# Patient Record
Sex: Female | Born: 1995 | Race: White | Hispanic: No | Marital: Single | Smoking: Never smoker
Health system: Southern US, Community
[De-identification: ages and names within clinical notes are randomized; demographics above are authoritative.]

## PROBLEM LIST (undated history)

## (undated) HISTORY — PX: WISDOM TOOTH EXTRACTION: SHX21

---

## 2017-02-23 ENCOUNTER — Encounter (HOSPITAL_BASED_OUTPATIENT_CLINIC_OR_DEPARTMENT_OTHER): Payer: Self-pay | Admitting: Emergency Medicine

## 2017-02-23 ENCOUNTER — Emergency Department (HOSPITAL_BASED_OUTPATIENT_CLINIC_OR_DEPARTMENT_OTHER)
Admission: EM | Admit: 2017-02-23 | Discharge: 2017-02-23 | Disposition: A | Discharge status: Home / Self Care | Payer: Worker's Compensation | Attending: Emergency Medicine | Admitting: Emergency Medicine

## 2017-02-23 DIAGNOSIS — Y9389 Activity, other specified: Secondary | ICD-10-CM | POA: Diagnosis not present

## 2017-02-23 DIAGNOSIS — W228XXA Striking against or struck by other objects, initial encounter: Secondary | ICD-10-CM | POA: Diagnosis not present

## 2017-02-23 DIAGNOSIS — S060X0A Concussion without loss of consciousness, initial encounter: Secondary | ICD-10-CM | POA: Insufficient documentation

## 2017-02-23 DIAGNOSIS — Y990 Civilian activity done for income or pay: Secondary | ICD-10-CM | POA: Diagnosis not present

## 2017-02-23 DIAGNOSIS — S098XXA Other specified injuries of head, initial encounter: Secondary | ICD-10-CM | POA: Diagnosis present

## 2017-02-23 DIAGNOSIS — Y92511 Restaurant or cafe as the place of occurrence of the external cause: Secondary | ICD-10-CM | POA: Diagnosis not present

## 2017-02-23 DIAGNOSIS — S0990XA Unspecified injury of head, initial encounter: Secondary | ICD-10-CM
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
## 2017-02-23 NOTE — ED Notes (Signed)
Pt given d/c instructions as per chart. Verbalizes understanding. No questions. 

## 2017-02-23 NOTE — Discharge Instructions (Signed)
Tylenol and Motrin for pain.  Use ice over the area that is swollen.  Return here for any worsening in your condition.
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
## 2020-01-02 ENCOUNTER — Encounter (HOSPITAL_BASED_OUTPATIENT_CLINIC_OR_DEPARTMENT_OTHER): Payer: Self-pay

## 2020-01-02 ENCOUNTER — Emergency Department (HOSPITAL_BASED_OUTPATIENT_CLINIC_OR_DEPARTMENT_OTHER)
Admission: EM | Admit: 2020-01-02 | Discharge: 2020-01-02 | Disposition: A | Discharge status: Home / Self Care | Payer: BC Managed Care – PPO | Attending: Emergency Medicine | Admitting: Emergency Medicine

## 2020-01-02 ENCOUNTER — Other Ambulatory Visit: Payer: Self-pay

## 2020-01-02 DIAGNOSIS — Z79899 Other long term (current) drug therapy: Secondary | ICD-10-CM | POA: Insufficient documentation

## 2020-01-02 DIAGNOSIS — Z20822 Contact with and (suspected) exposure to covid-19: Secondary | ICD-10-CM | POA: Diagnosis not present

## 2020-01-02 DIAGNOSIS — F410 Panic disorder [episodic paroxysmal anxiety] without agoraphobia: Secondary | ICD-10-CM | POA: Diagnosis not present

## 2020-01-02 MED ORDER — HYDROXYZINE HCL 25 MG PO TABS: 25.0000 mg | ORAL_TABLET | Freq: Four times a day (QID) | ORAL | 0 refills | Status: AC | PRN

## 2020-01-02 MED ORDER — LORAZEPAM 2 MG/ML IJ SOLN
1.0000 mg | Freq: Once | INTRAMUSCULAR | Status: AC
  Administered 2020-01-02: 1 mg via INTRAMUSCULAR
  Filled 2020-01-02: qty 1

## 2020-01-02 NOTE — ED Triage Notes (Addendum)
Pt states she has not been feeling well for 3 days symptoms include generalized malaise, abd pain, nausea, vomiting. Today she started to have some lightheadedness and generalized fatigue. Pt states while on the way to the Dr., she started to get anxious and then developed tingling in her hands bilaterally and hand cramps. Pt noted to have hyperventilations during triage.

## 2020-01-02 NOTE — ED Notes (Signed)
Patient now texting on phone, NAD returned to lobby, RN aware.

## 2020-01-02 NOTE — ED Notes (Signed)
Patient's mother has arrived to drive her home

## 2020-01-02 NOTE — Discharge Instructions (Signed)
You likely have a panic attack.  Take hydroxyzine for anxiety.  Stay hydrated.  You were tested for Covid and please stay home until results come back.  If you have Covid you will need to stay home for 10 days  Return to ER if you have worse numbness or weakness or trouble speaking, fever, trouble breathing

## 2020-01-02 NOTE — ED Provider Notes (Signed)
[facility] EMERGENCY DEPARTMENT Provider Note   CSN: [id number] Arrival date & time: 01/02/20  1657     History Chief Complaint  Patient presents with  . Panic Attack    Kari Schneider is a 24 y.o. female previous history of Covid, here presenting with anxiety and nausea.  Patient states that she has been having sore throat for the last several days.  She states that her mother was sick with similar symptoms and tested negative for Covid.  She states that she had Covid previously and then had the vaccine.  She denies any obvious Covid exposure.  She has some nausea and muscle aches for last several days.  She states that she was trying to feeding to urgent care and there was no appointment and was driving home and suddenly had a panic attack.  She states that she was hyperventilating and then both of her hands became numb.  While in triage, she was given a brown bag and she states that now she feels better.  She is requesting something for anxiety.  The history is provided by the patient.       History reviewed. No pertinent past medical history.  There are no problems to display for this patient.   History reviewed. No pertinent surgical history.   OB History   No obstetric history on file.     No family history on file.  Social History   Tobacco Use  . Smoking status: Never Smoker  . Smokeless tobacco: Never Used  Vaping Use  . Vaping Use: Some days  Substance Use Topics  . Alcohol use: Yes  . Drug use: Not on file    Home Medications Prior to Admission medications   Medication Sig Start Date End Date Taking? Authorizing Provider  Norgestimate-Ethinyl Estradiol Triphasic (TRINESSA, 28,) 0.18/0.215/0.25 MG-35 MCG tablet TK 1 T PO QD 04/29/15  Yes [provider]    Allergies    Patient has no known allergies.  Review of Systems   Review of Systems  Neurological: Positive for numbness.  Psychiatric/Behavioral: The patient is  nervous/anxious.   All other systems reviewed and are negative.   Physical Exam Updated Vital Signs BP (!) 132/110 (BP Location: Right Arm)   Pulse (!) 126   Resp 16   Ht 5\' 4"  (1.626 m)   Wt 59 kg   LMP 12/23/2019   SpO2 100%   BMI 22.31 kg/m   Physical Exam Vitals and nursing note reviewed.  Constitutional:      Comments: Anxious   HENT:     Head: Normocephalic.     Nose: Nose normal.     Mouth/Throat:     Mouth: Mucous membranes are dry.     Comments: Posterior pharynx is clear Eyes:     Extraocular Movements: Extraocular movements intact.     Pupils: Pupils are equal, round, and reactive to light.  Cardiovascular:     Rate and Rhythm: Normal rate and regular rhythm.     Pulses: Normal pulses.     Heart sounds: Normal heart sounds.  Pulmonary:     Effort: Pulmonary effort is normal.     Breath sounds: Normal breath sounds.  Abdominal:     General: Abdomen is flat.     Palpations: Abdomen is soft.  Musculoskeletal:        General: Normal range of motion.     Cervical back: Normal range of motion.  Skin:    General: Skin is warm.  Capillary Refill: Capillary refill takes less than 2 seconds.  Neurological:     General: No focal deficit present.     Mental Status: She is oriented to person, place, and time.     Cranial Nerves: No cranial nerve deficit.     Sensory: No sensory deficit.     Comments: Normal sensation and strength bilaterally.  No facial droop and cranial nerves II to XII intact  Psychiatric:        Mood and Affect: Mood normal.        Behavior: Behavior normal.     ED Results / Procedures / Treatments   Labs (all labs ordered are listed, but only abnormal results are displayed) Labs Reviewed  SARS CORONAVIRUS 2 (TAT 6-24 HRS)    EKG None  Radiology No results found.  Procedures Procedures (including critical care time)  Medications Ordered in ED Medications  LORazepam (ATIVAN) injection 1 mg (has no administration in time  range)    ED Course  I have reviewed the triage vital signs and the nursing notes.  Pertinent labs & imaging results that were available during my care of the patient were reviewed by me and considered in my medical decision making (see chart for details).    MDM Rules/Calculators/A&P                          Kari Schneider is a 24 y.o. female here presenting with panic attack.  Patient was tachycardic on arrival.  After some reassurance her heart rate went down to around 100.  Patient had some numbness initially but no neuro deficit on my exam.  Has viral URI symptoms and patient did get the Covid vaccine previously.  Patient is not hypoxic and lungs are clear.  Patient requested Covid test.  She also requests something for anxiety.  Given Ativan in the ED and will discharge home with hydroxyzine.  Final Clinical Impression(s) / ED Diagnoses Final diagnoses:  None    Rx / DC Orders ED Discharge Orders    None       Charlynne Pander, MD 01/02/20 1918

## 2020-01-03 LAB — SARS CORONAVIRUS 2 (TAT 6-24 HRS): SARS Coronavirus 2: NEGATIVE

## 2020-10-26 ENCOUNTER — Encounter (HOSPITAL_BASED_OUTPATIENT_CLINIC_OR_DEPARTMENT_OTHER): Payer: Self-pay | Admitting: Emergency Medicine

## 2020-10-26 ENCOUNTER — Emergency Department (HOSPITAL_BASED_OUTPATIENT_CLINIC_OR_DEPARTMENT_OTHER)
Admission: EM | Admit: 2020-10-26 | Discharge: 2020-10-26 | Disposition: A | Discharge status: Home / Self Care | Payer: BC Managed Care – PPO | Attending: Emergency Medicine | Admitting: Emergency Medicine

## 2020-10-26 ENCOUNTER — Other Ambulatory Visit: Payer: Self-pay

## 2020-10-26 DIAGNOSIS — F419 Anxiety disorder, unspecified: Secondary | ICD-10-CM | POA: Insufficient documentation

## 2020-10-26 DIAGNOSIS — R110 Nausea: Secondary | ICD-10-CM | POA: Diagnosis not present

## 2020-10-26 MED ORDER — ALPRAZOLAM 0.5 MG PO TABS
0.2500 mg | ORAL_TABLET | Freq: Once | ORAL | Status: AC
  Administered 2020-10-26: 11:00:00 0.25 mg via ORAL
  Filled 2020-10-26: qty 1

## 2020-10-26 MED ORDER — ALPRAZOLAM 0.25 MG PO TABS: 0.2500 mg | ORAL_TABLET | Freq: Every day | ORAL | 0 refills | Status: DC | PRN

## 2020-10-26 NOTE — ED Provider Notes (Signed)
[facility] EMERGENCY DEPARTMENT Provider Note   CSN: [id number] Arrival date & time: 10/26/20  5009     History Chief Complaint  Patient presents with   Anxiety    Kari Schneider is a 25 y.o. female.  Comes in to ER with concern for anxiety.  Patient reports that she struggles a lot with anxiety.  Followed closely by her primary care doctor.  Has been taking antidepressant as well as Xanax.  States that she ran out of her prescription of Xanax a couple days ago and decided to stop.  Had been taking it most days.  Has not had any seizure-like activity.  No episodes of passing out.  Since stopping the medication she is having increasing anxiety, is concerned that she is having an anxiety attack.  Has had some associated nausea.  No vomiting.  Feels at times depersonalized.  Does not have any thoughts of hurting other people or hurting herself.  Yesterday also did not take her previously prescribed Paxil.  Has had major life stressors recently, mother is currently hospitalized for acute psychosis.  Brother is schizophrenic and on the streets in [place].  HPI     History reviewed. No pertinent past medical history.  There are no problems to display for this patient.   History reviewed. No pertinent surgical history.   OB History   No obstetric history on file.     No family history on file.  Social History   Tobacco Use   Smoking status: Never   Smokeless tobacco: Never  Vaping Use   Vaping Use: Some days  Substance Use Topics   Alcohol use: Yes    Home Medications Prior to Admission medications   Medication Sig Start Date End Date Taking? Authorizing Provider  ALPRAZolam (XANAX) 0.25 MG tablet Take 1 tablet (0.25 mg total) by mouth daily as needed for anxiety. 10/26/20  Yes Milagros Loll, MD  hydrOXYzine (ATARAX/VISTARIL) 25 MG tablet Take 1 tablet (25 mg total) by mouth every 6 (six) hours as needed. 01/02/20   Charlynne Pander, MD  Norgestimate-Ethinyl  Estradiol Triphasic (TRINESSA, 28,) 0.18/0.215/0.25 MG-35 MCG tablet TK 1 T PO QD 04/29/15   [provider]    Allergies    Patient has no known allergies.  Review of Systems   Review of Systems  Constitutional:  Negative for chills and fever.  HENT:  Negative for ear pain and sore throat.   Eyes:  Negative for pain and visual disturbance.  Respiratory:  Negative for cough and shortness of breath.   Cardiovascular:  Negative for chest pain and palpitations.  Gastrointestinal:  Negative for abdominal pain and vomiting.  Genitourinary:  Negative for dysuria and hematuria.  Musculoskeletal:  Negative for arthralgias and back pain.  Skin:  Negative for color change and rash.  Neurological:  Negative for seizures and syncope.  Psychiatric/Behavioral:  The patient is nervous/anxious.   All other systems reviewed and are negative.  Physical Exam Updated Vital Signs BP 120/75 (BP Location: Right Arm)   Pulse 74   Temp 99.5 F (37.5 C)   Resp 16   Ht 5\' 4"  (1.626 m)   Wt 58.1 kg   SpO2 99%   BMI 21.97 kg/m   Physical Exam Vitals and nursing note reviewed.  Constitutional:      General: She is not in acute distress.    Appearance: She is well-developed.  HENT:     Head: Normocephalic and atraumatic.  Eyes:  Conjunctiva/sclera: Conjunctivae normal.  Cardiovascular:     Rate and Rhythm: Normal rate and regular rhythm.     Heart sounds: No murmur heard. Pulmonary:     Effort: Pulmonary effort is normal. No respiratory distress.     Breath sounds: Normal breath sounds.  Abdominal:     Palpations: Abdomen is soft.     Tenderness: There is no abdominal tenderness.  Musculoskeletal:        General: No deformity or signs of injury.     Cervical back: Neck supple.  Skin:    General: Skin is warm and dry.  Neurological:     General: No focal deficit present.     Mental Status: She is alert and oriented to person, place, and time.  Psychiatric:     Comments:  Somewhat anxious but calm, no SI or HI    ED Results / Procedures / Treatments   Labs (all labs ordered are listed, but only abnormal results are displayed) Labs Reviewed - No data to display  EKG None  Radiology No results found.  Procedures Procedures   Medications Ordered in ED Medications  ALPRAZolam Prudy Feeler) tablet 0.25 mg (0.25 mg Oral Given 10/26/20 1044)    ED Course  I have reviewed the triage vital signs and the nursing notes.  Pertinent labs & imaging results that were available during my care of the patient were reviewed by me and considered in my medical decision making (see chart for details).    MDM Rules/Calculators/A&P                          25 year old presented to ER with concern for anxiety.  Has long history of anxiety, followed closely by her primary care doctor.  States that she ran out of her Xanax a couple days ago.  Reviewed PMP.  Last Xanax filled on 6/10 0.25 mg 30 tablets.  Patient appears somewhat anxious but not in any sort of distress.  Vital signs are all normal.  She does not appear to be having any frank withdrawal symptoms.  Recommend that she continue her Xanax as needed for now but follow-up closely with her primary care doctor to discuss longer-term plan.  Gave patient very small Rx until patient is able to have follow-up with primary doctor. No SI/HI/AVH today.     After the discussed management above, the patient was determined to be safe for discharge.  The patient was in agreement with this plan and all questions regarding their care were answered.  ED return precautions were discussed and the patient will return to the ED with any significant worsening of condition.  Final Clinical Impression(s) / ED Diagnoses Final diagnoses:  Anxiety    Rx / DC Orders ED Discharge Orders          Ordered    ALPRAZolam (XANAX) 0.25 MG tablet  Daily PRN        10/26/20 1051             Milagros Loll, MD 10/26/20 1058

## 2020-10-26 NOTE — ED Triage Notes (Signed)
Pt reports anxiety attack since yesterday. Pt stating that she stopped her anti depressant and xanax 2 days ago. Denies SI/HI.

## 2020-10-26 NOTE — Discharge Instructions (Addendum)
Recommend continuing your previously prescribed medications as instructed by your primary doctor.  Please call their clinic this morning to request a close follow-up appointment, ideally to be seen in the next couple days.  I have sent in a short prescription for your Xanax intended to cover you until you are able to be seen by her primary doctor to develop longer-term plan.  If you have any seizure-like activity, develop vomiting, thoughts of hurting herself or hurting other people, come back to ER for reassessment.

## 2020-11-16 ENCOUNTER — Emergency Department (HOSPITAL_BASED_OUTPATIENT_CLINIC_OR_DEPARTMENT_OTHER)
Admission: EM | Admit: 2020-11-16 | Discharge: 2020-11-16 | Disposition: A | Discharge status: Home / Self Care | Payer: BC Managed Care – PPO | Attending: Emergency Medicine | Admitting: Emergency Medicine

## 2020-11-16 ENCOUNTER — Encounter (HOSPITAL_BASED_OUTPATIENT_CLINIC_OR_DEPARTMENT_OTHER): Payer: Self-pay | Admitting: Emergency Medicine

## 2020-11-16 ENCOUNTER — Other Ambulatory Visit: Payer: Self-pay

## 2020-11-16 ENCOUNTER — Emergency Department (HOSPITAL_BASED_OUTPATIENT_CLINIC_OR_DEPARTMENT_OTHER): Payer: BC Managed Care – PPO

## 2020-11-16 DIAGNOSIS — F410 Panic disorder [episodic paroxysmal anxiety] without agoraphobia: Secondary | ICD-10-CM | POA: Insufficient documentation

## 2020-11-16 DIAGNOSIS — R0600 Dyspnea, unspecified: Secondary | ICD-10-CM | POA: Diagnosis not present

## 2020-11-16 DIAGNOSIS — Z79899 Other long term (current) drug therapy: Secondary | ICD-10-CM | POA: Diagnosis not present

## 2020-11-16 DIAGNOSIS — R0602 Shortness of breath: Secondary | ICD-10-CM | POA: Diagnosis present

## 2020-11-16 NOTE — ED Provider Notes (Signed)
[facility] EMERGENCY DEPARTMENT Provider Note   CSN: [id number] Arrival date & time: 11/16/20  0945     History Chief Complaint  Patient presents with   Anxiety    Kari Schneider is a 25 y.o. female.  Was recently switched from Xanax to clonazepam.  She states that she was doing therapy until last week.  She stopped liking her therapist and is also interested in in person not virtual options.  Still under significant amount of personal stress.   The history is provided by the patient and a parent.  Shortness of Breath Severity:  Moderate Onset quality:  Sudden Duration:  1 day Timing:  Intermittent Progression:  Unchanged Chronicity:  New Context comment:  History of anxiety, panic. Recent switch in the type of benzodiazepine she is taking. Unsure if this is panic or other issue. Relieved by:  Nothing Worsened by:  Nothing Ineffective treatments: tried taking additional benzodiazepines. Associated symptoms: no abdominal pain, no chest pain, no cough, no ear pain, no fever, no rash, no sore throat and no vomiting       History reviewed. No pertinent past medical history.  There are no problems to display for this patient.   History reviewed. No pertinent surgical history.   OB History   No obstetric history on file.     No family history on file.  Social History   Tobacco Use   Smoking status: Never   Smokeless tobacco: Never  Vaping Use   Vaping Use: Some days  Substance Use Topics   Alcohol use: Yes    Home Medications Prior to Admission medications   Medication Sig Start Date End Date Taking? Authorizing Provider  ALPRAZolam (XANAX) 0.25 MG tablet Take 1 tablet (0.25 mg total) by mouth daily as needed for anxiety. 10/26/20   Milagros Loll, MD  clonazePAM (KLONOPIN) 0.5 MG tablet Take 0.5 mg by mouth 2 (two) times daily as needed. 11/15/20   [provider]  hydrOXYzine (ATARAX/VISTARIL) 25 MG tablet Take 1 tablet (25 mg total) by  mouth every 6 (six) hours as needed. 01/02/20   Charlynne Pander, MD  Norgestimate-Ethinyl Estradiol Triphasic (TRINESSA, 28,) 0.18/0.215/0.25 MG-35 MCG tablet TK 1 T PO QD 04/29/15   [provider]    Allergies    Patient has no known allergies.  Review of Systems   Review of Systems  Constitutional:  Negative for chills and fever.  HENT:  Negative for ear pain and sore throat.   Eyes:  Negative for pain and visual disturbance.  Respiratory:  Positive for shortness of breath. Negative for cough.   Cardiovascular:  Negative for chest pain and palpitations.  Gastrointestinal:  Negative for abdominal pain and vomiting.  Genitourinary:  Negative for dysuria and hematuria.  Musculoskeletal:  Negative for arthralgias and back pain.  Skin:  Negative for color change and rash.  Neurological:  Negative for seizures and syncope.  All other systems reviewed and are negative.  Physical Exam Updated Vital Signs BP 119/77 (BP Location: Right Arm)   Pulse 90   Temp 98 F (36.7 C) (Oral)   Resp (!) 22   Ht 5\' 4"  (1.626 m)   Wt 58.1 kg   SpO2 98%   BMI 21.97 kg/m   Physical Exam Vitals and nursing note reviewed.  Constitutional:      Appearance: She is well-developed.  HENT:     Head: Normocephalic and atraumatic.  Cardiovascular:     Rate and Rhythm: Normal rate and  regular rhythm.     Heart sounds: Normal heart sounds.  Pulmonary:     Effort: Pulmonary effort is normal. No tachypnea.     Breath sounds: Normal breath sounds.  Musculoskeletal:     Right lower leg: No edema.     Left lower leg: No edema.  Skin:    General: Skin is warm and dry.  Neurological:     General: No focal deficit present.     Mental Status: She is alert and oriented to person, place, and time.  Psychiatric:        Mood and Affect: Mood normal.        Behavior: Behavior normal.    ED Results / Procedures / Treatments   Labs (all labs ordered are listed, but only abnormal results are  displayed) Labs Reviewed - No data to display  EKG EKG Interpretation  Date/Time:  Wednesday November 16 2020 09:58:20 EDT Ventricular Rate:  97 PR Interval:  117 QRS Duration: 85 QT Interval:  355 QTC Calculation: 451 R Axis:   75 Text Interpretation: Sinus rhythm Borderline short PR interval normal axis No acute ischemia Confirmed by Pieter Partridge ([id number]) on 11/16/2020 10:29:31 AM  Radiology DG Chest 2 View  Result Date: 11/16/2020 CLINICAL DATA:  Shortness of breath EXAM: CHEST - 2 VIEW COMPARISON:  09/22/2018 FINDINGS: The heart size and mediastinal contours are within normal limits. Both lungs are clear. The visualized skeletal structures are unremarkable. IMPRESSION: Normal study. Electronically Signed   By: Charlett Nose M.D.   On: 11/16/2020 11:02    Procedures Procedures   Medications Ordered in ED Medications - No data to display  ED Course  I have reviewed the triage vital signs and the nursing notes.  Pertinent labs & imaging results that were available during my care of the patient were reviewed by me and considered in my medical decision making (see chart for details).    MDM Rules/Calculators/A&P                           Matthew Pais presents with dyspnea and anxiety symptoms.  Symptoms do seem consistent with her history of panic disorder, but in order to exclude other etiologies such as pneumonia, cardiac arrhythmia, obvious myocardial infarction, chest x-ray and EKG were performed.  Symptoms do not seem consistent with COVID-19.  She was advised to contact her prescriber as benzodiazepine modifications will be handled by this prescriber.  We discussed the fact that this medication has a possibility to be habit-forming, and just like an opioid, it needs to be prescribed by one person.  We also discussed resuming therapy. Final Clinical Impression(s) / ED Diagnoses Final diagnoses:  Shortness of breath  Panic disorder    Rx / DC Orders ED Discharge Orders      None        Koleen Distance, MD 11/16/20 1112

## 2020-11-16 NOTE — ED Notes (Signed)
Pt discharged to home. Discharge instructions have been discussed with patient and/or family members. Pt verbally acknowledges understanding d/c instructions, and endorses comprehension to checkout at registration before leaving.  °

## 2020-11-16 NOTE — ED Triage Notes (Signed)
Pt reports feeling anxious yesterday and SHOB that started yesterday afternoon. Pt reports her Dr. Riley Churches her anxiety medication recently.

## 2021-04-04 ENCOUNTER — Emergency Department (HOSPITAL_COMMUNITY)
Admission: EM | Admit: 2021-04-04 | Discharge: 2021-04-04 | Disposition: A | Discharge status: Home / Self Care | Payer: BC Managed Care – PPO | Attending: Emergency Medicine | Admitting: Emergency Medicine

## 2021-04-04 ENCOUNTER — Emergency Department (HOSPITAL_COMMUNITY): Payer: BC Managed Care – PPO

## 2021-04-04 ENCOUNTER — Encounter (HOSPITAL_COMMUNITY): Payer: Self-pay | Admitting: Emergency Medicine

## 2021-04-04 ENCOUNTER — Other Ambulatory Visit: Payer: Self-pay

## 2021-04-04 DIAGNOSIS — R519 Headache, unspecified: Secondary | ICD-10-CM | POA: Diagnosis not present

## 2021-04-04 DIAGNOSIS — Z3A19 19 weeks gestation of pregnancy: Secondary | ICD-10-CM | POA: Diagnosis not present

## 2021-04-04 DIAGNOSIS — O9A212 Injury, poisoning and certain other consequences of external causes complicating pregnancy, second trimester: Secondary | ICD-10-CM | POA: Insufficient documentation

## 2021-04-04 DIAGNOSIS — O26892 Other specified pregnancy related conditions, second trimester: Secondary | ICD-10-CM | POA: Insufficient documentation

## 2021-04-04 DIAGNOSIS — R1030 Lower abdominal pain, unspecified: Secondary | ICD-10-CM | POA: Insufficient documentation

## 2021-04-04 DIAGNOSIS — Y92410 Unspecified street and highway as the place of occurrence of the external cause: Secondary | ICD-10-CM | POA: Diagnosis not present

## 2021-04-04 DIAGNOSIS — M542 Cervicalgia: Secondary | ICD-10-CM | POA: Insufficient documentation

## 2021-04-04 LAB — COMPREHENSIVE METABOLIC PANEL
ALT: 19 U/L (ref 0–44)
AST: 25 U/L (ref 15–41)
Albumin: 3.2 g/dL — ABNORMAL LOW (ref 3.5–5.0)
Alkaline Phosphatase: 44 U/L (ref 38–126)
Anion gap: 7 (ref 5–15)
BUN: 5 mg/dL — ABNORMAL LOW (ref 6–20)
CO2: 22 mmol/L (ref 22–32)
Calcium: 8.9 mg/dL (ref 8.9–10.3)
Chloride: 107 mmol/L (ref 98–111)
Creatinine, Ser: 0.53 mg/dL (ref 0.44–1.00)
GFR, Estimated: >60 mL/min (ref >60)
Glucose, Bld: 106 mg/dL — ABNORMAL HIGH (ref 70–99)
Potassium: 3.6 mmol/L (ref 3.5–5.1)
Sodium: 136 mmol/L (ref 135–145)
Total Bilirubin: 0.4 mg/dL (ref 0.3–1.2)
Total Protein: 5.9 g/dL — ABNORMAL LOW (ref 6.5–8.1)

## 2021-04-04 LAB — I-STAT CHEM 8, ED
BUN: 5 mg/dL — ABNORMAL LOW (ref 6–20)
Calcium, Ion: 1.2 mmol/L (ref 1.15–1.40)
Chloride: 105 mmol/L (ref 98–111)
Creatinine, Ser: 0.5 mg/dL (ref 0.44–1.00)
Glucose, Bld: 101 mg/dL — ABNORMAL HIGH (ref 70–99)
HCT: 30 % — ABNORMAL LOW (ref 36.0–46.0)
Hemoglobin: 10.2 g/dL — ABNORMAL LOW (ref 12.0–15.0)
Potassium: 3.7 mmol/L (ref 3.5–5.1)
Sodium: 137 mmol/L (ref 135–145)
TCO2: 24 mmol/L (ref 22–32)

## 2021-04-04 LAB — CBC
HCT: 31.2 % — ABNORMAL LOW (ref 36.0–46.0)
Hemoglobin: 10.7 g/dL — ABNORMAL LOW (ref 12.0–15.0)
MCH: 31.5 pg (ref 26.0–34.0)
MCHC: 34.3 g/dL (ref 30.0–36.0)
MCV: 91.8 fL (ref 80.0–100.0)
Platelets: 227 10*3/uL (ref 150–400)
RBC: 3.4 MIL/uL — ABNORMAL LOW (ref 3.87–5.11)
RDW: 13.2 % (ref 11.5–15.5)
WBC: 7.6 10*3/uL (ref 4.0–10.5)
nRBC: 0 % (ref 0.0–0.2)

## 2021-04-04 LAB — SAMPLE TO BLOOD BANK

## 2021-04-04 LAB — LACTIC ACID, PLASMA: Lactic Acid, Venous: 1.2 mmol/L (ref 0.5–1.9)

## 2021-04-04 LAB — PROTIME-INR
INR: 1 (ref 0.8–1.2)
Prothrombin Time: 12.8 seconds (ref 11.4–15.2)

## 2021-04-04 LAB — ETHANOL: Alcohol, Ethyl (B): <10 mg/dL (ref <10)

## 2021-04-04 LAB — KLEIHAUER-BETKE STAIN
Fetal Cells %: 0 %
Quantitation Fetal Hemoglobin: 0 mL

## 2021-04-04 MED ORDER — ACETAMINOPHEN 500 MG PO TABS
1000.0000 mg | ORAL_TABLET | Freq: Once | ORAL | Status: AC
  Administered 2021-04-04: 1000 mg via ORAL
  Filled 2021-04-04: qty 2

## 2021-04-04 NOTE — ED Notes (Signed)
US at bedside with patient
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
## 2021-04-04 NOTE — ED Notes (Signed)
Patient transported to CT w/ [place] TRN 

## 2021-04-04 NOTE — Progress Notes (Signed)
°   04/04/21 1440  Clinical Encounter Type  Visited With Patient not available  Visit Type Trauma  Referral From Nurse  Consult/Referral To Chaplain   Chaplain Tery Sanfilippo responded to the Level 2 page. The medical team is attending to the patient. Donnajean Lopes spoke to the charge nurse. She said the patient was fine and chaplain support is not needed.There is no support person here at this time. Advise that the Chaplain remains available for follow-up spiritual and emotional support as needed.This note was prepared by Deneen Harts, M.Div..  For questions please contact by phone [phone].
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
## 2021-04-05 LAB — ABO/RH: ABO/RH(D): O POS
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
